# Patient Record
Sex: Male | Born: 1935 | Race: White | Hispanic: No | Smoking: Former smoker
Health system: Southern US, Community
[De-identification: ages and names within clinical notes are randomized; demographics above are authoritative.]

## PROBLEM LIST (undated history)

## (undated) DIAGNOSIS — J449 Chronic obstructive pulmonary disease, unspecified: Secondary | ICD-10-CM

## (undated) DIAGNOSIS — E119 Type 2 diabetes mellitus without complications: Secondary | ICD-10-CM

## (undated) DIAGNOSIS — J984 Other disorders of lung: Secondary | ICD-10-CM

## (undated) DIAGNOSIS — J439 Emphysema, unspecified: Secondary | ICD-10-CM

## (undated) DIAGNOSIS — C801 Malignant (primary) neoplasm, unspecified: Secondary | ICD-10-CM

## (undated) HISTORY — DX: Malignant (primary) neoplasm, unspecified: C80.1

## (undated) HISTORY — DX: Emphysema, unspecified: J43.9

## (undated) HISTORY — DX: Chronic obstructive pulmonary disease, unspecified: J44.9

## (undated) HISTORY — DX: Type 2 diabetes mellitus without complications: E11.9

## (undated) HISTORY — DX: Other disorders of lung: J98.4

---

## 1997-07-06 DIAGNOSIS — C801 Malignant (primary) neoplasm, unspecified: Secondary | ICD-10-CM

## 1997-07-06 HISTORY — PX: PROSTATE SURGERY: SHX751

## 1997-07-06 HISTORY — DX: Malignant (primary) neoplasm, unspecified: C80.1

## 2003-07-07 HISTORY — PX: TOTAL HIP ARTHROPLASTY: SHX124

## 2003-07-30 ENCOUNTER — Other Ambulatory Visit: Payer: Self-pay

## 2004-06-13 ENCOUNTER — Ambulatory Visit: Payer: Self-pay | Admitting: Family Medicine

## 2004-07-06 ENCOUNTER — Ambulatory Visit: Payer: Self-pay | Admitting: Family Medicine

## 2004-08-06 ENCOUNTER — Ambulatory Visit: Payer: Self-pay | Admitting: Family Medicine

## 2005-01-12 ENCOUNTER — Ambulatory Visit: Payer: Self-pay | Admitting: Family Medicine

## 2005-02-03 ENCOUNTER — Ambulatory Visit: Payer: Self-pay | Admitting: Family Medicine

## 2005-03-31 ENCOUNTER — Encounter: Payer: Self-pay | Admitting: Rheumatology

## 2005-04-05 ENCOUNTER — Encounter: Payer: Self-pay | Admitting: Rheumatology

## 2005-05-06 ENCOUNTER — Encounter: Payer: Self-pay | Admitting: Rheumatology

## 2009-06-14 ENCOUNTER — Ambulatory Visit: Payer: Self-pay | Admitting: Family Medicine

## 2009-09-02 ENCOUNTER — Ambulatory Visit: Payer: Self-pay | Admitting: Ophthalmology

## 2009-09-11 ENCOUNTER — Ambulatory Visit: Payer: Self-pay | Admitting: Ophthalmology

## 2009-10-04 ENCOUNTER — Ambulatory Visit: Payer: Self-pay | Admitting: Ophthalmology

## 2009-10-16 ENCOUNTER — Ambulatory Visit: Payer: Self-pay | Admitting: Ophthalmology

## 2010-07-23 ENCOUNTER — Encounter
Admission: RE | Admit: 2010-07-23 | Discharge: 2010-07-23 | Payer: Self-pay | Source: Home / Self Care | Attending: Orthopedic Surgery | Admitting: Orthopedic Surgery

## 2010-09-08 ENCOUNTER — Ambulatory Visit: Payer: Self-pay | Admitting: Urology

## 2011-04-07 ENCOUNTER — Inpatient Hospital Stay: Payer: Self-pay | Admitting: Internal Medicine

## 2011-04-07 ENCOUNTER — Other Ambulatory Visit: Payer: Self-pay | Admitting: Family Medicine

## 2011-04-16 ENCOUNTER — Ambulatory Visit: Payer: Self-pay | Admitting: Urology

## 2011-12-17 ENCOUNTER — Ambulatory Visit: Payer: Self-pay | Admitting: General Surgery

## 2012-01-13 ENCOUNTER — Ambulatory Visit: Payer: Self-pay | Admitting: General Surgery

## 2012-04-12 ENCOUNTER — Ambulatory Visit: Payer: Self-pay | Admitting: Urology

## 2012-08-11 ENCOUNTER — Ambulatory Visit: Payer: Self-pay | Admitting: Urology

## 2012-11-03 ENCOUNTER — Ambulatory Visit: Payer: Self-pay | Admitting: Specialist

## 2013-02-08 ENCOUNTER — Ambulatory Visit: Payer: Self-pay | Admitting: Specialist

## 2013-04-17 ENCOUNTER — Ambulatory Visit: Payer: Self-pay | Admitting: Urology

## 2013-11-29 ENCOUNTER — Ambulatory Visit (INDEPENDENT_AMBULATORY_CARE_PROVIDER_SITE_OTHER): Payer: Medicare Other | Admitting: General Surgery

## 2013-11-29 ENCOUNTER — Encounter: Payer: Self-pay | Admitting: General Surgery

## 2013-11-29 VITALS — BP 122/72 | HR 86 | Resp 16 | Ht 72.0 in | Wt 308.0 lb

## 2013-11-29 DIAGNOSIS — R1909 Other intra-abdominal and pelvic swelling, mass and lump: Secondary | ICD-10-CM

## 2013-11-29 DIAGNOSIS — R1900 Intra-abdominal and pelvic swelling, mass and lump, unspecified site: Secondary | ICD-10-CM

## 2013-11-29 NOTE — Progress Notes (Signed)
Patient ID: Mitchell Patterson, male   DOB: February 04, 1932, 80 y.o.   MRN: [id number]  Chief Complaint  Patient presents with  . Other    right groin mass    HPI [name] is a 80 y.o. male.  Here for right groin mass. States he noticed it about 6 months ago. He denies any pain to that area. He also denies any change in size. States it seems to be "hard".   Known history of left groin "fatty tissue". He states that it has gotten a little larger.   He uses oxygen at night and as needed. Denies any urinary issues except with the prominent fatty tissue in the groin, the penis is shrinking. Right testicle changed after the mumps years ago.  The patient's daughter, Mitchell Patterson,  was present for the interview and post exam discussion.  HPI  Past Medical History  Diagnosis Date  . Cancer 1999    prostate  . Diabetes mellitus without complication   . COPD (chronic obstructive pulmonary disease)   . Emphysema of lung     Past Surgical History  Procedure Laterality Date  . Prostate surgery  1999  . Total hip arthroplasty  2005    Family History  Problem Relation Age of Onset  . Cancer Mother 67    breast    Social History History  Substance Use Topics  . Smoking status: Former Smoker -- 30 years    Quit date: 07/06/1978  . Smokeless tobacco: Never Used  . Alcohol Use: No    Allergies  Allergen Reactions  . Codeine Nausea Only    Current Outpatient Prescriptions  Medication Sig Dispense Refill  . allopurinol (ZYLOPRIM) 300 MG tablet Take 300 mg by mouth daily.       Marland Kitchen BREO ELLIPTA 100-25 MCG/INH AEPB 1 puff daily.       Marland Kitchen gabapentin (NEURONTIN) 300 MG capsule Take 300 mg by mouth 2 (two) times daily.       Marland Kitchen GLIPIZIDE XL 10 MG 24 hr tablet Take 10 mg by mouth daily with breakfast.       . metFORMIN (GLUCOPHAGE) 1000 MG tablet Take 1,000 mg by mouth daily with breakfast.       . pioglitazone (ACTOS) 45 MG tablet Take 45 mg by mouth daily.       . potassium  citrate (UROCIT-K) 10 MEQ (1080 MG) SR tablet Take 10 mEq by mouth 3 (three) times daily with meals.       . simvastatin (ZOCOR) 20 MG tablet Take by mouth daily at 6 PM.        No current facility-administered medications for this visit.    Review of Systems Review of Systems  Constitutional: Negative.   Respiratory: Positive for cough and shortness of breath.   Cardiovascular: Negative.   Gastrointestinal: Negative for nausea, vomiting, diarrhea and constipation.    Blood pressure 122/72, pulse 86, resp. rate 16, height 6' (1.829 m), weight 308 lb (139.708 kg).  Physical Exam Physical Exam  Constitutional: He is oriented to person, place, and time. He appears well-developed and well-nourished.  Cardiovascular: Normal rate, regular rhythm and normal heart sounds.   Pulmonary/Chest: Effort normal and breath sounds normal.  Abdominal: Soft. Normal appearance.  Well healed lower midline incision.   Genitourinary:     Standing position there is soft tissue mass left groin. Top of left scrotum 4 x 5 cm mass similar on the right as well. Left testicle is normal, right is almost gone.  In lying position there is a soft tissue thickening top of scrotum 5 x 6 cm.   Neurological: He is alert and oriented to person, place, and time.  Skin: Skin is warm and dry.    Data Reviewed CT scan obtained 12/17/2011 was reviewed once again. No evidence of abdominal wall defect or inguinal hernia. No discernible, discrete soft tissue mass corresponding to the long-standing left-sided swelling.  Assessment    Soft tissue swelling in the groin without clear evidence of inguinal hernia.    Plan    The patient is having no urinary difficulties. No bowel difficulties. Scant discomfort in the groin area. He is up. High risk for any operative intervention, and considering the negative results with the CT scan 2 years ago in the groin swelling on the left side first appeared, I would not be  optimistic the repeat CT which show anything today.  The patient and I are comfortable with observation.    Discussed observation for 6 months.  PCP: Particia Jasper Athel Patterson 11/30/2013, 10:07 AM

## 2013-11-29 NOTE — Patient Instructions (Signed)
The patient is aware to call back for any questions or concerns.  

## 2013-11-30 DIAGNOSIS — R1909 Other intra-abdominal and pelvic swelling, mass and lump: Secondary | ICD-10-CM | POA: Insufficient documentation

## 2013-12-19 ENCOUNTER — Encounter: Payer: Self-pay | Admitting: General Surgery

## 2014-03-16 ENCOUNTER — Ambulatory Visit: Payer: Self-pay | Admitting: Specialist

## 2014-05-29 ENCOUNTER — Encounter: Payer: Self-pay | Admitting: General Surgery

## 2014-05-29 ENCOUNTER — Ambulatory Visit (INDEPENDENT_AMBULATORY_CARE_PROVIDER_SITE_OTHER): Payer: Medicare Other | Admitting: General Surgery

## 2014-05-29 VITALS — BP 128/76 | HR 133 | Resp 18 | Ht 72.0 in | Wt 319.0 lb

## 2014-05-29 DIAGNOSIS — R1909 Other intra-abdominal and pelvic swelling, mass and lump: Secondary | ICD-10-CM

## 2014-05-29 NOTE — Patient Instructions (Addendum)
Patient to be scheduled for abdomen/pelvis CT scan. The patient is aware to call back for any questions or concerns.  Patient is scheduled for a CT abdomen/pelvis with contrast at [facility] on 06/06/14 at 1:30 pm. He will arrive by 1:15 pm with a list of his medications. He is to hold his Metformin that day and for 48 hours afterwards. He is to have no solid foods and only clear liquids for 4 hours prior. He will pick up a prep kit today. Patient will have labs done that day. Patient is aware of date, time, and instructions.

## 2014-05-29 NOTE — Progress Notes (Signed)
Patient ID: Mitchell Cote Sr., male   DOB: [birth year], 80 y.o.   MRN: [id number]  Chief Complaint  Patient presents with  . Follow-up    right groin mass    HPI Mitchell Oriley Sr. is a 80 y.o. male here today for a six month evaluation of a right groin mass. Patient states he has no pain but it has gotten larger in size. Otherwise doing well. There is no reported difficulty in bowel or bladder function. He was accompanied by his daughter who was present for the interview and post exam discussion.   HPI  Past Medical History  Diagnosis Date  . Cancer 1999    prostate  . Diabetes mellitus without complication   . COPD (chronic obstructive pulmonary disease)   . Emphysema of lung   . Lung disease     Past Surgical History  Procedure Laterality Date  . Prostate surgery  1999  . Total hip arthroplasty  2005    Family History  Problem Relation Age of Onset  . Cancer Mother 72    breast    Social History History  Substance Use Topics  . Smoking status: Former Smoker -- 30 years    Quit date: 07/06/1978  . Smokeless tobacco: Never Used  . Alcohol Use: No    Allergies  Allergen Reactions  . Codeine Nausea Only    Current Outpatient Prescriptions  Medication Sig Dispense Refill  . allopurinol (ZYLOPRIM) 300 MG tablet Take 300 mg by mouth daily.     . B Complex Vitamins (B-COMPLEX/B-12 PO) Take by mouth.    Marland Kitchen BREO ELLIPTA 100-25 MCG/INH AEPB 1 puff daily.     . fluticasone (FLONASE) 50 MCG/ACT nasal spray Place into both nostrils daily.    . furosemide (LASIX) 20 MG tablet Take 20 mg by mouth daily.    Marland Kitchen gabapentin (NEURONTIN) 300 MG capsule Take 300 mg by mouth 2 (two) times daily.     Marland Kitchen GLIPIZIDE XL 10 MG 24 hr tablet Take 10 mg by mouth daily with breakfast.     . latanoprost (XALATAN) 0.005 % ophthalmic solution 1 drop at bedtime.    . metFORMIN (GLUCOPHAGE) 1000 MG tablet Take 1,000 mg by mouth daily with breakfast.     . Omega-3 Fatty  Acids (FISH OIL) 1200 MG CAPS Take by mouth.    . pioglitazone (ACTOS) 45 MG tablet Take 45 mg by mouth daily.     . potassium citrate (UROCIT-K) 10 MEQ (1080 MG) SR tablet Take 10 mEq by mouth 3 (three) times daily with meals.     . simvastatin (ZOCOR) 20 MG tablet Take by mouth daily at 6 PM.      No current facility-administered medications for this visit.    Review of Systems Review of Systems  Constitutional: Negative.   Respiratory: Negative.   Cardiovascular: Negative.     Blood pressure 128/76, pulse 133, resp. rate 18, height 6' (1.829 m), weight 319 lb (144.697 kg), SpO2 87 %. The patient's weight is up 11 pounds from his May 2015 exam Physical Exam Physical Exam  Constitutional: He is oriented to person, place, and time. He appears well-developed and well-nourished.  Cardiovascular: Normal rate, regular rhythm and normal heart sounds.   No murmur heard. Pulmonary/Chest: Effort normal and breath sounds normal.  Abdominal: Soft. Normal appearance. There is no hepatosplenomegaly. There is no tenderness. No hernia.  Genitourinary:     No tenderness noted in the area of soft tissue swelling. No  erythema or induration.  Neurological: He is alert and oriented to person, place, and time.  Skin: Skin is warm and dry.     Assessment    Soft tissue swelling consistent with lipoma of both groins without evidence of hernia defect.    Plan    The patient's progressive weight gain is of concern, and may account for some of his perceived change in the soft tissue swelling in the lower abdomen.  The patient's daughter requested biopsy to confirm a clinical impression. I reviewed with her that biopsy of adipose tissue is poorly correlated with pathology and that in 2013 when a CT scan was completed no discernible abnormality was reported. This gentleman is at high risk for visiting the medical facility let alone having a biopsy in an  area fraught with infection. We settled on a  compromise of obtaining a CT scan to re-assess the area.    Patient is scheduled for a CT abdomen/pelvis with contrast at [facility] on 06/06/14 at 1:30 pm. He will arrive by 1:15 pm with a list of his medications. He is to hold his Metformin that day and for 48 hours afterwards. He is to have no solid foods and only clear liquids for 4 hours prior. He will pick up a prep kit today. Patient will have labs done that day. Patient is aware of date, time, and instructions.   PCP:  Virgie Dad 05/31/2014, 5:47 AM

## 2014-06-06 ENCOUNTER — Ambulatory Visit: Payer: Self-pay | Admitting: General Surgery

## 2014-06-06 ENCOUNTER — Encounter: Payer: Self-pay | Admitting: General Surgery

## 2014-06-07 ENCOUNTER — Ambulatory Visit: Payer: Self-pay | Admitting: General Surgery

## 2014-11-12 ENCOUNTER — Other Ambulatory Visit: Payer: Self-pay | Admitting: Specialist

## 2014-11-12 DIAGNOSIS — R590 Localized enlarged lymph nodes: Secondary | ICD-10-CM

## 2014-11-12 DIAGNOSIS — J8410 Pulmonary fibrosis, unspecified: Secondary | ICD-10-CM

## 2015-03-12 ENCOUNTER — Ambulatory Visit
Admission: RE | Admit: 2015-03-12 | Discharge: 2015-03-12 | Disposition: A | Discharge status: Home / Self Care | Payer: Medicare Other | Source: Ambulatory Visit | Attending: Specialist | Admitting: Specialist

## 2015-03-12 DIAGNOSIS — J479 Bronchiectasis, uncomplicated: Secondary | ICD-10-CM | POA: Insufficient documentation

## 2015-03-12 DIAGNOSIS — N200 Calculus of kidney: Secondary | ICD-10-CM | POA: Insufficient documentation

## 2015-03-12 DIAGNOSIS — R590 Localized enlarged lymph nodes: Secondary | ICD-10-CM

## 2015-03-12 DIAGNOSIS — I2510 Atherosclerotic heart disease of native coronary artery without angina pectoris: Secondary | ICD-10-CM | POA: Insufficient documentation

## 2015-03-12 DIAGNOSIS — J8410 Pulmonary fibrosis, unspecified: Secondary | ICD-10-CM | POA: Diagnosis present

## 2015-09-06 IMAGING — CR DG ABDOMEN 1V
1 series · 2 of 2 positions shown · non-contrast
Comparison: none

REASON FOR EXAM: nephrolithiasis and renal colic
COMMENTS:

[Series 1: ap · 0.17mm/px · 2 of 2 slices shown]
[im 1/2]
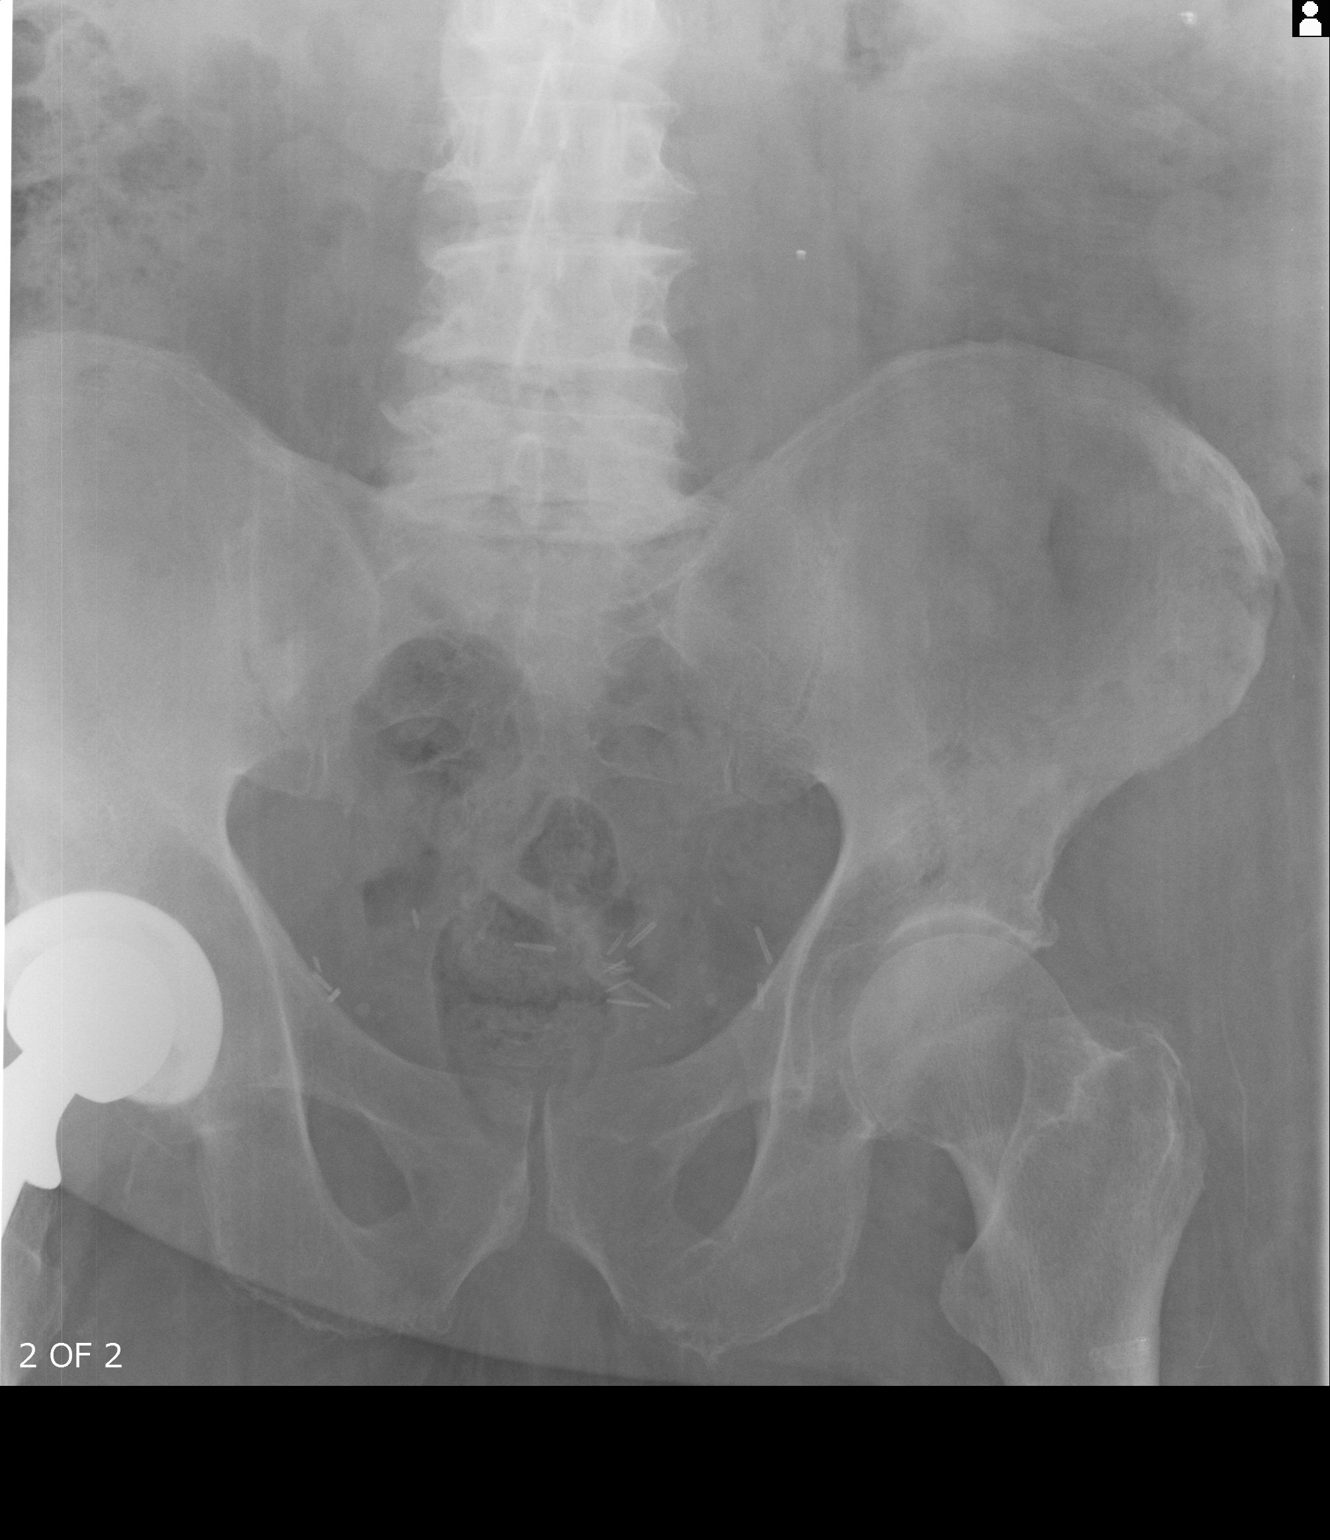
[im 2/2]
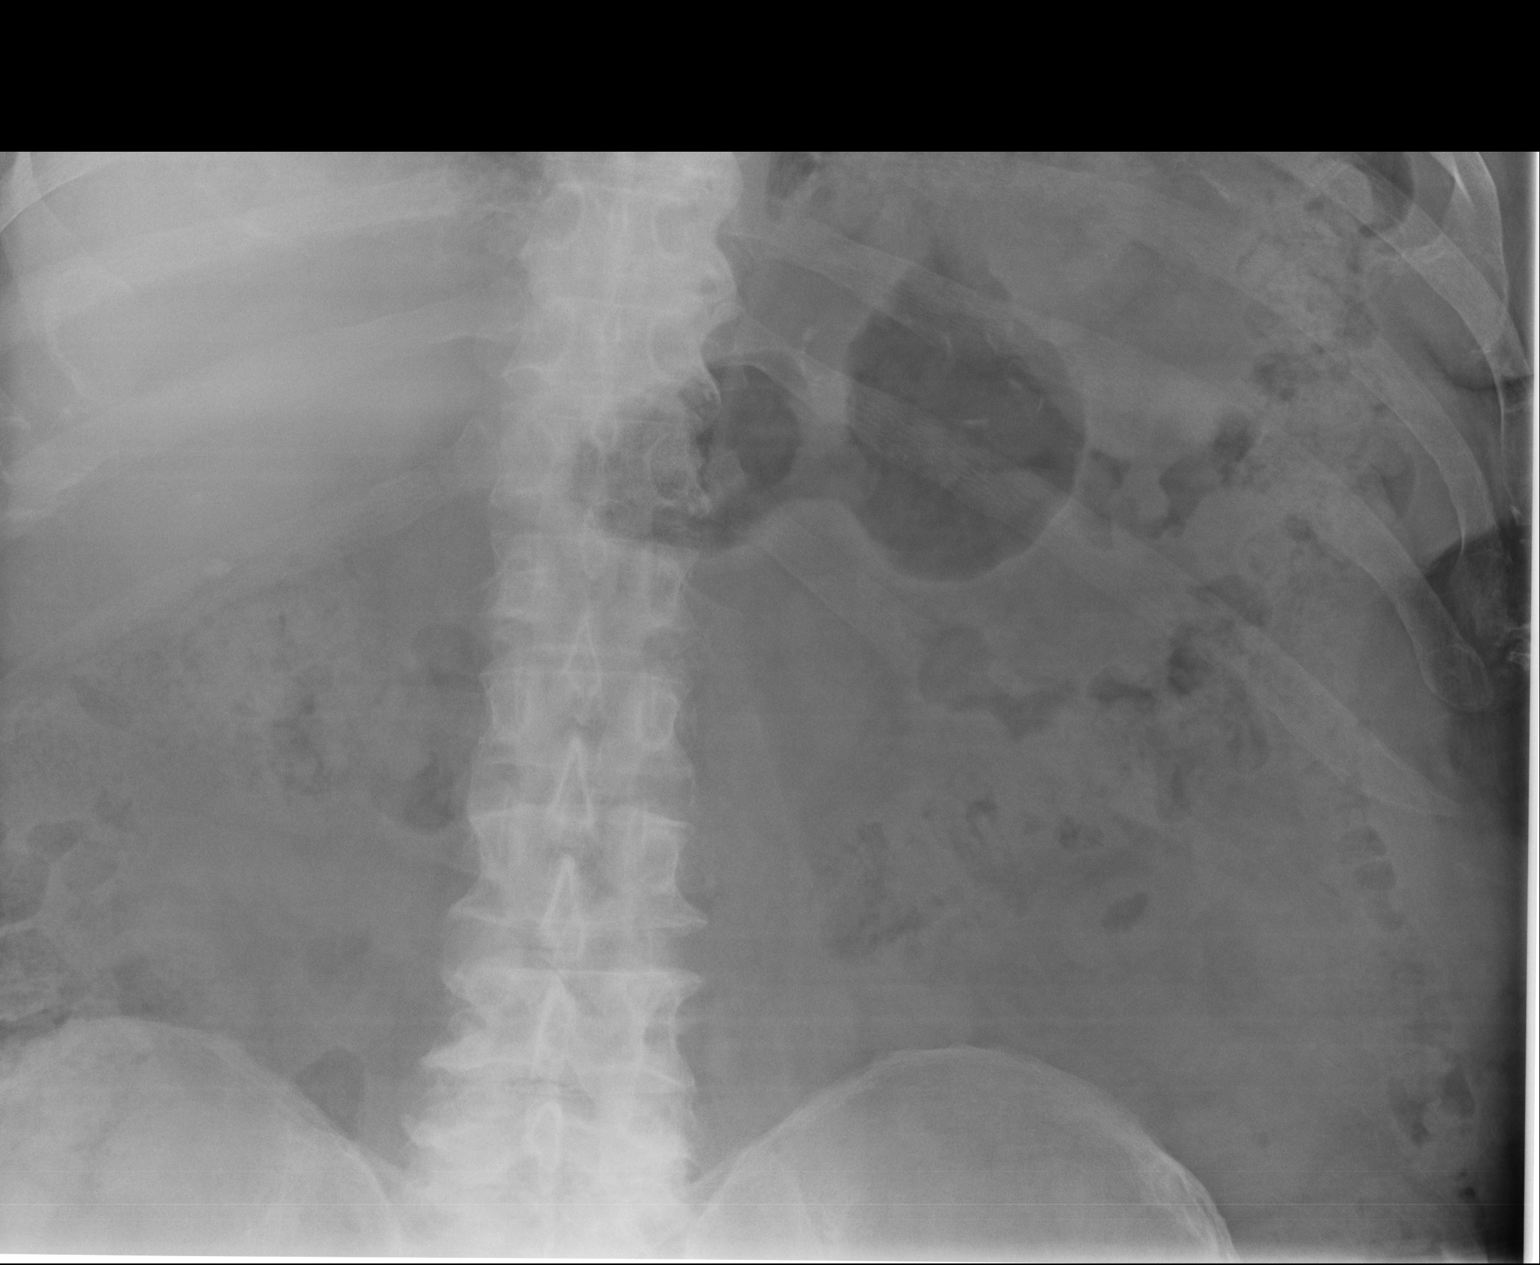

[2 of 2 positions shown; findings below may reference images not displayed]

PROCEDURE:     DXR - DXR KIDNEY URETER BLADDER  - April 17, 2013 [DATE]

RESULT:     Soft tissue structures are unremarkable. Calculi noted over the
right kidney. Sclerotic density noted over the right sacrum is unchanged
from prior study of 08/11/2012 and may represent bone island. Phleboliths in
pelvis unchanged. Surgical clips in pelvis. Severe degenerative changes of
the thoracolumbar spine. Right hip replacement. Degenerative changes left
hip.
IMPRESSION: Right nephrolithiasis. Similar findings noted on 08/11/2012.

## 2015-11-28 ENCOUNTER — Other Ambulatory Visit: Payer: Self-pay | Admitting: Specialist

## 2015-11-28 DIAGNOSIS — R0602 Shortness of breath: Secondary | ICD-10-CM

## 2015-11-28 DIAGNOSIS — J8410 Pulmonary fibrosis, unspecified: Secondary | ICD-10-CM

## 2015-12-10 ENCOUNTER — Ambulatory Visit
Admission: RE | Admit: 2015-12-10 | Discharge: 2015-12-10 | Disposition: A | Discharge status: Home / Self Care | Payer: Medicare Other | Source: Ambulatory Visit | Attending: Specialist | Admitting: Specialist

## 2015-12-10 DIAGNOSIS — I2510 Atherosclerotic heart disease of native coronary artery without angina pectoris: Secondary | ICD-10-CM | POA: Diagnosis not present

## 2015-12-10 DIAGNOSIS — R0602 Shortness of breath: Secondary | ICD-10-CM | POA: Diagnosis present

## 2015-12-10 DIAGNOSIS — J8410 Pulmonary fibrosis, unspecified: Secondary | ICD-10-CM | POA: Insufficient documentation

## 2015-12-10 DIAGNOSIS — N200 Calculus of kidney: Secondary | ICD-10-CM | POA: Insufficient documentation

## 2016-05-06 DEATH — deceased

## 2018-04-30 IMAGING — CT CT CHEST HIGH RESOLUTION W/O CM
1 of 3 series · 14 of 33 positions shown, 18 images · non-contrast
Comparison: 03/12/2015 chest CT.

CLINICAL DATA: Pulmonary fibrosis. Worsening shortness of breath on
full time oxygen. History of prostate cancer.

EXAM:
CT CHEST WITHOUT CONTRAST
TECHNIQUE: Multidetector CT imaging of the chest was performed following the
standard protocol without intravenous contrast. High resolution
imaging of the lungs, as well as inspiratory and expiratory imaging,
was performed.

[Series 2: thorax · axial · 0.86mm/px · z∈[-677,-383]mm · 14 of 161 slices shown, 18 images]
[im 7/161  mediastinal]
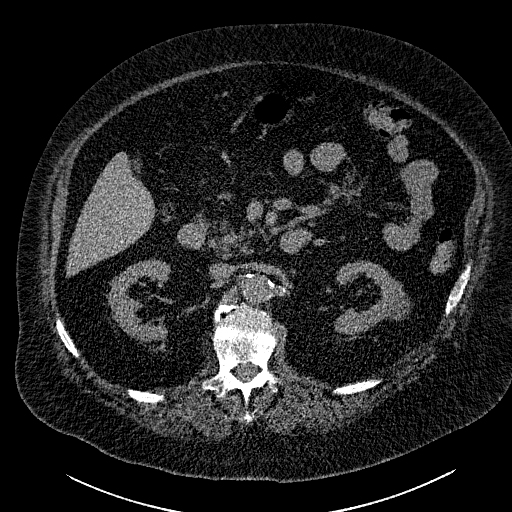
[im 7/161  lung]
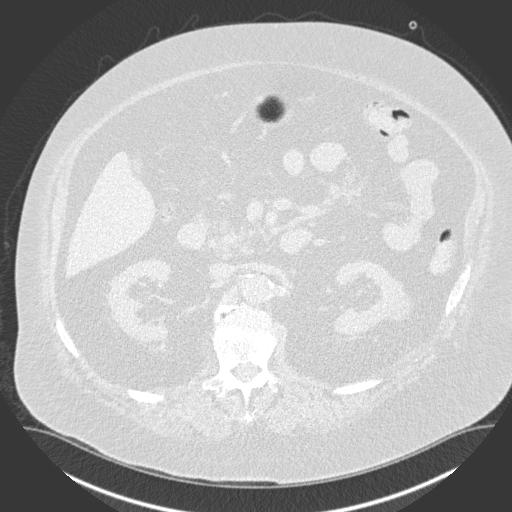
[im 21/161  lung]
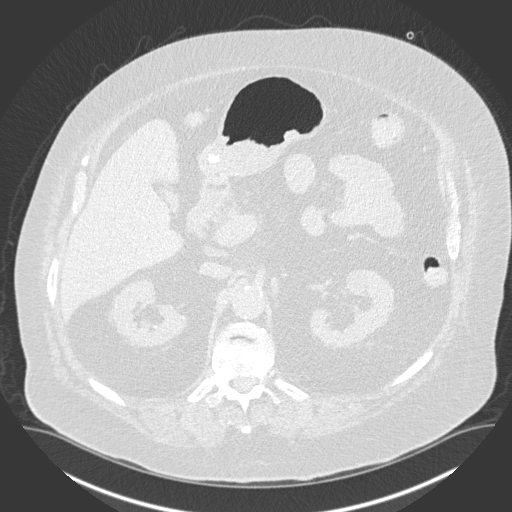
[im 35/161  lung]
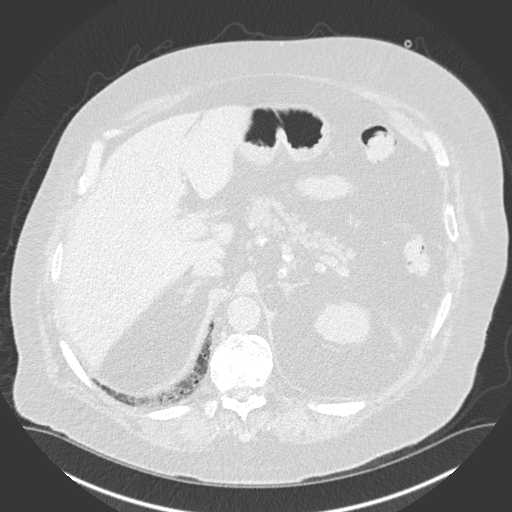
[im 49/161  lung]
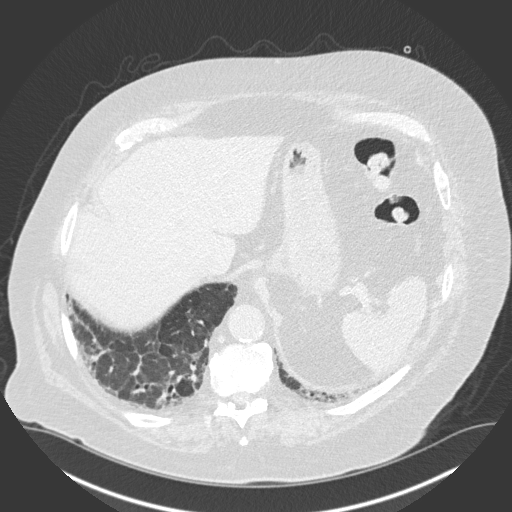
[im 56/161  mediastinal]
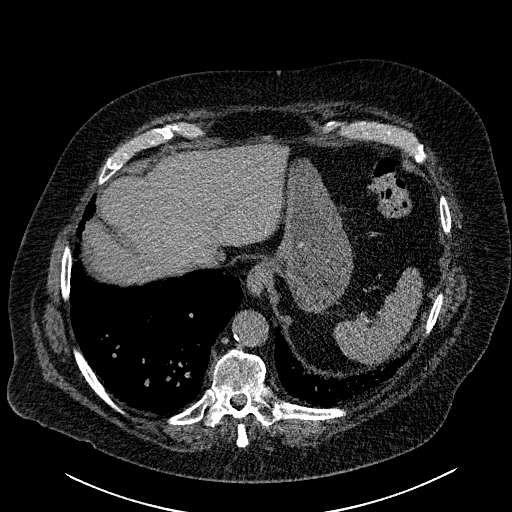
[im 56/161  lung]
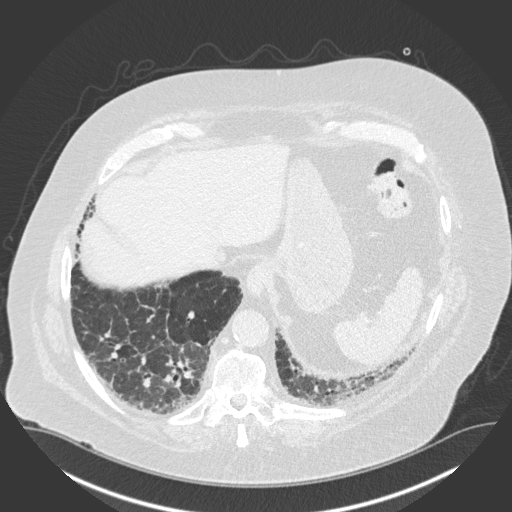
[im 63/161  lung]
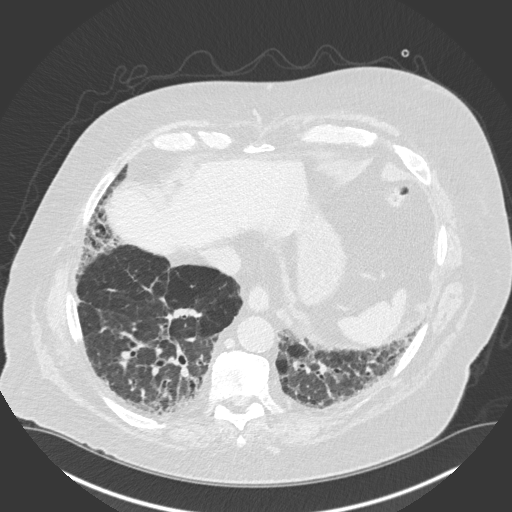
[im 76/161  lung]
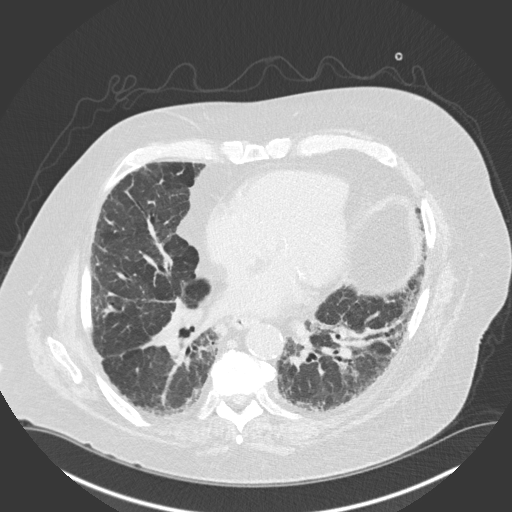
[im 84/161  lung]
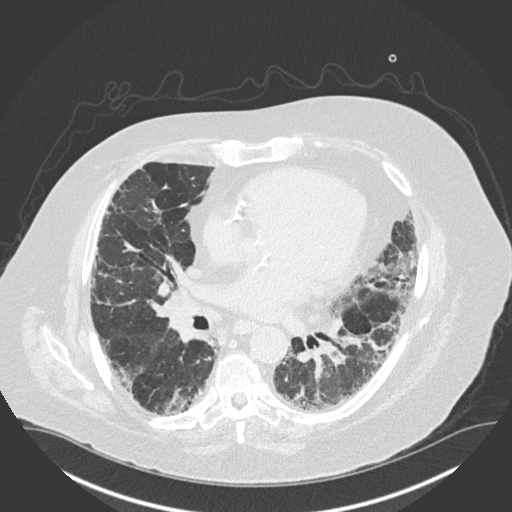
[im 98/161  mediastinal]
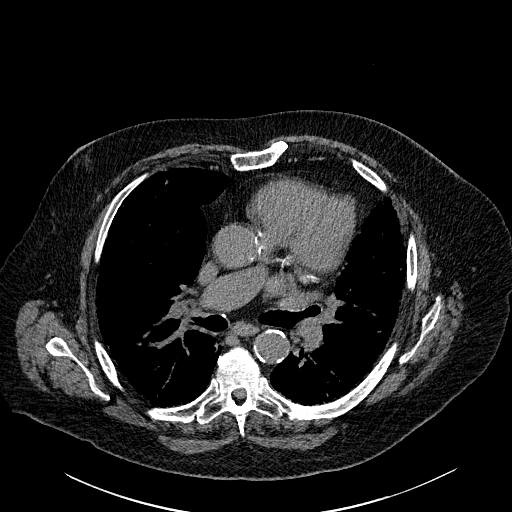
[im 98/161  lung]
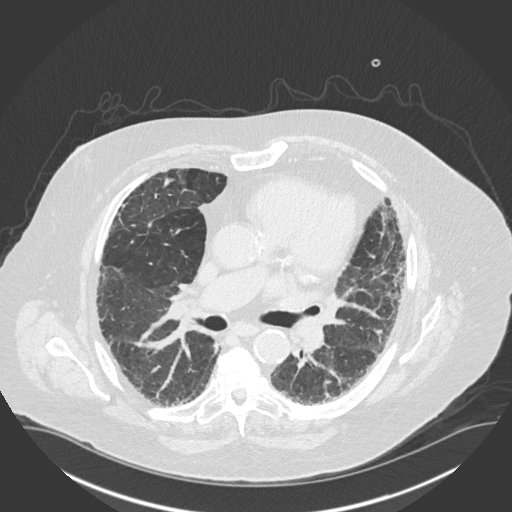
[im 105/161  lung]
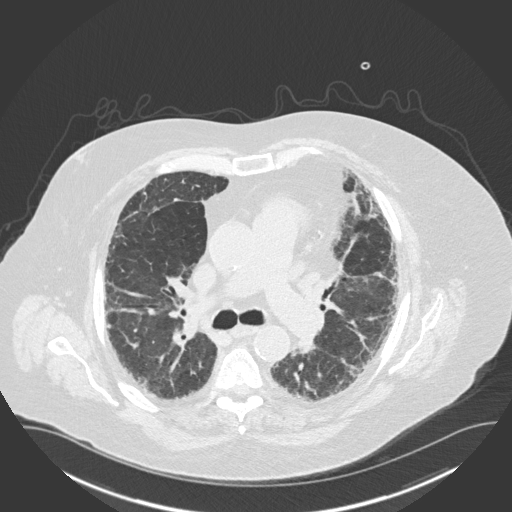
[im 112/161  lung]
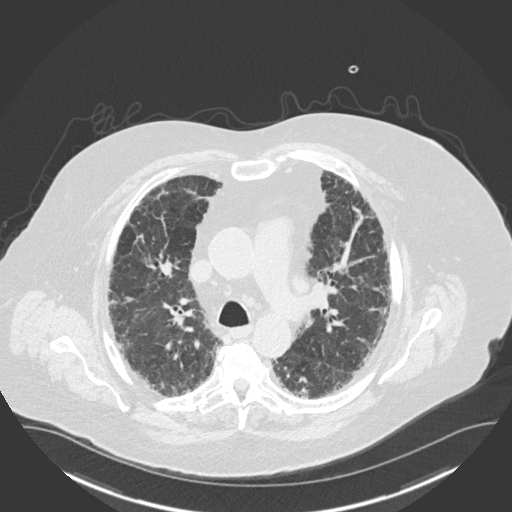
[im 126/161  lung]
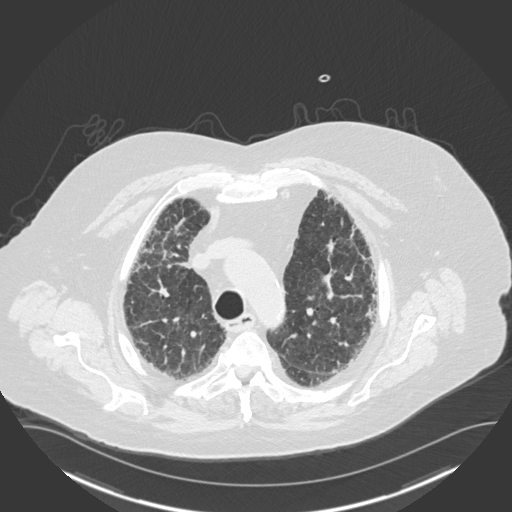
[im 140/161  mediastinal]
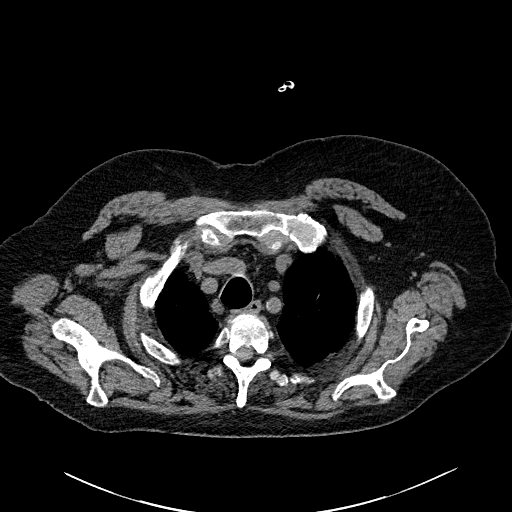
[im 140/161  lung]
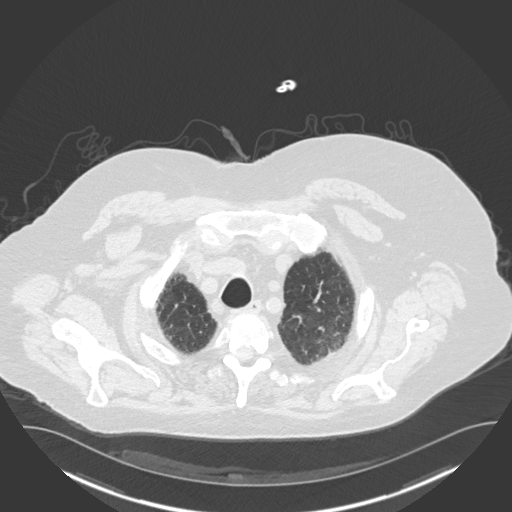
[im 154/161  lung]
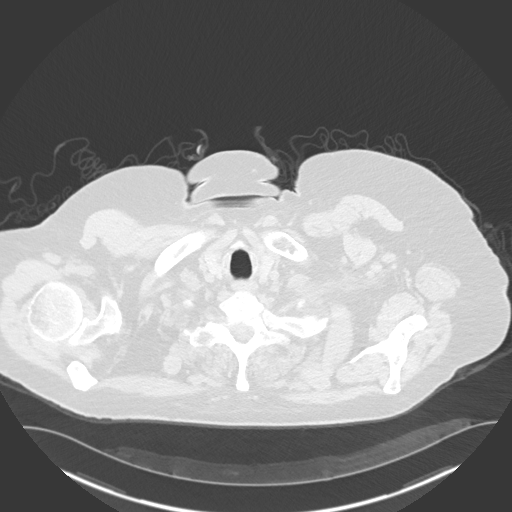

[14 of 33 positions shown; findings below may reference images not displayed]

FINDINGS: Mediastinum/Nodes: Normal heart size. No pericardial
fluid/thickening. Left main, left anterior descending, left
circumflex and right coronary atherosclerosis. Atherosclerotic
nonaneurysmal thoracic aorta. Stable dilated main pulmonary artery
(3.5 cm diameter). Normal visualized thyroid. Normal esophagus. No
axillary adenopathy. Stable mildly enlarged 1.1 cm right lower
paratracheal node (series 2/ image 55). Stable mildly enlarged
cm subcarinal node (series 2/ image 82). Stable mildly enlarged
cm AP window node (series 2/ image 53). Probable stable mild
bilateral hilar lymphadenopathy, poorly delineated on this
noncontrast study. No new pathologically enlarged mediastinal nodes.

Lungs/Pleura: No pneumothorax. No pleural effusion. There is diffuse
peribronchovascular and subpleural reticulation and mild
ground-glass attenuation throughout both lungs with associated
moderate traction bronchiectasis, with a slight basilar
predominance. There is mild honeycombing in the peripheral right
middle lobe. These findings have mildly progressed in the interval.
Re- demonstrated is a mosaic attenuation throughout the lungs due to
air trapping on the expiration sequence. No acute consolidative
airspace disease, significant pulmonary nodules or lung masses.

Upper abdomen: Nonobstructing stones in the visualized kidneys
bilaterally measuring up to the 8 mm in the right mid kidney and 5
mm in the left mid kidney. Partially visualized exophytic lateral
interpolar left renal cyst measuring at least 4.9 cm, the visualized
portions of which appear simple.

Musculoskeletal: No aggressive appearing focal osseous lesions.
Moderate degenerative changes in the thoracic spine.
IMPRESSION: 1. Mild interval progression of fibrotic interstitial lung disease.
The continued progression, slight basilar predominance and presence
of mild honeycombing are most suggestive of usual interstitial
pneumonia (UIP). However, the presence of air trapping and the
relatively prominent involvement of the peribronchovascular lungs is
not typical of usual interstitial pneumonia, and raises the
alternative diagnosis of chronic hypersensitivity pneumonitis.
2. Stable mild mediastinal and bilateral hilar lymphadenopathy, most
consistent with benign reactive adenopathy.
3. Left main and 3 vessel coronary atherosclerosis.
4. Stable dilated main pulmonary artery, suggesting pulmonary
arterial hypertension .
5. Bilateral nonobstructing renal stones.
